# Patient Record
Sex: Female | Born: 1965 | Race: Black or African American | Hispanic: No | Marital: Married | Smoking: Never smoker
Health system: Southern US, Community
[De-identification: ages and names within clinical notes are randomized; demographics above are authoritative.]

## PROBLEM LIST (undated history)

## (undated) HISTORY — PX: OTHER SURGICAL HISTORY: SHX169

---

## 1999-11-18 ENCOUNTER — Emergency Department (HOSPITAL_COMMUNITY): Admission: EM | Admit: 1999-11-18 | Discharge: 1999-11-18 | Payer: Self-pay | Admitting: Emergency Medicine

## 1999-11-18 ENCOUNTER — Encounter: Payer: Self-pay | Admitting: Emergency Medicine

## 2000-05-25 ENCOUNTER — Encounter (HOSPITAL_COMMUNITY): Admission: RE | Admit: 2000-05-25 | Discharge: 2000-07-07 | Payer: Self-pay | Admitting: Obstetrics & Gynecology

## 2000-06-01 ENCOUNTER — Encounter: Payer: Self-pay | Admitting: *Deleted

## 2000-07-06 ENCOUNTER — Inpatient Hospital Stay (HOSPITAL_COMMUNITY): Admission: AD | Admit: 2000-07-06 | Discharge: 2000-07-09 | Payer: Self-pay | Admitting: *Deleted

## 2000-07-06 ENCOUNTER — Encounter (INDEPENDENT_AMBULATORY_CARE_PROVIDER_SITE_OTHER): Payer: Self-pay | Admitting: Specialist

## 2007-06-22 ENCOUNTER — Encounter: Admission: RE | Admit: 2007-06-22 | Discharge: 2007-06-22 | Payer: Self-pay | Admitting: Obstetrics and Gynecology

## 2008-06-26 ENCOUNTER — Encounter: Admission: RE | Admit: 2008-06-26 | Discharge: 2008-06-26 | Payer: Self-pay | Admitting: Obstetrics and Gynecology

## 2008-12-19 ENCOUNTER — Ambulatory Visit (HOSPITAL_COMMUNITY): Admission: RE | Admit: 2008-12-19 | Discharge: 2008-12-19 | Payer: Self-pay | Admitting: Family Medicine

## 2009-06-27 ENCOUNTER — Encounter: Admission: RE | Admit: 2009-06-27 | Discharge: 2009-06-27 | Payer: Self-pay | Admitting: Obstetrics and Gynecology

## 2009-12-03 ENCOUNTER — Encounter: Admission: RE | Admit: 2009-12-03 | Discharge: 2009-12-03 | Payer: Self-pay | Admitting: Chiropractic Medicine

## 2010-07-22 ENCOUNTER — Encounter
Admission: RE | Admit: 2010-07-22 | Discharge: 2010-07-22 | Payer: Self-pay | Source: Home / Self Care | Attending: Obstetrics and Gynecology | Admitting: Obstetrics and Gynecology

## 2010-11-14 NOTE — Op Note (Signed)
[facility]  Patient:    Kelsey Glenn, Kelsey Glenn                     MRN: [id number] Proc. Date: 07/07/00 Adm. Date:  [id number] Disc. Date: [id number] Attending:  Antionette Char Dictator:   Jamey Reas, M.D.                           Operative Report  DATE OF BIRTH:                February 25, 1966.  PREOPERATIVE DIAGNOSIS:       Desire for permanent sterilization, multiparity.  POSTOPERATIVE DIAGNOSIS:      Desire for permanent sterilization, multiparity.  PROCEDURE:                    Postpartum tubal ligation, Pomeroy method.  SURGEON:                      Charles A. Clearance Coots, M.D.  ASSISTANT:                    Jamey Reas, M.D.  ANESTHESIA:                   General endotracheal.  COMPLICATIONS:                None.  ESTIMATED BLOOD LOSS:         Less than 20 cc.  FLUIDS:                       Minimal.  INDICATIONS:                  A 45 year old G3, P3-0-0-3, status post an SVD who desires permanent sterilization. Risks and benefits of procedure discussed with patient including risk of failure 3-5:1000 with increased risk of an ectopic gestation if pregnancy occurs.  FINDINGS:                     Normal uterus, tubes, and ovaries.  DESCRIPTION OF PROCEDURE:     Patient was taken to the operating room where general anesthesia was introduced. Seven cc of Marcaine 0.25% were induced along the area where the incision was going to be made. A small transverse infraumbilical skin incision was then made with the scalpel. The incision was carried down to the underlying fascia until the peritoneum was identified and entered. Peritoneum was noted to be free of any adhesions and the incision was then extended with Metzenbaum scissors. Patients left fallopian tube was then identified, brought to the incision, and grasped with Babcock clamp. Tube then followed out to the fimbria. Babcock clamp was then used to grasp the tube approximately  4 cm from the cornual region. A 3-cm segment of tube was then ligated with two free ties of plain gut and excised. Good hemostasis was noted and the tube returned to the abdomen. The right fallopian tube was then ligated and a 3-cm segment excised in a similar fashion. Excellent hemostasis was noted and the tube returned to the abdomen. The peritoneum and fascia were then closed in a single layer using 3-0 Vicryl suture. A 2-0 Monocryl suture was used to place three interrupted sutures in the subcutaneous area. Skin was then closed in a subcuticular fashion using 4-0 Monocryl suture. The patient tolerated the procedure well. Sponge, lap,  and needle counts were correct x 2. The patient was taken to the recovery room in stable condition.  PATHOLOGY:                    Segments of right and left fallopian tubes. DD:  07/07/00 TD:  07/07/00 Job: [id number]

## 2011-07-30 ENCOUNTER — Encounter (HOSPITAL_COMMUNITY): Payer: Self-pay | Admitting: *Deleted

## 2011-07-30 ENCOUNTER — Emergency Department (INDEPENDENT_AMBULATORY_CARE_PROVIDER_SITE_OTHER): Payer: 59

## 2011-07-30 ENCOUNTER — Emergency Department (INDEPENDENT_AMBULATORY_CARE_PROVIDER_SITE_OTHER)
Admission: EM | Admit: 2011-07-30 | Discharge: 2011-07-30 | Disposition: A | Discharge status: Home / Self Care | Payer: 59 | Source: Home / Self Care | Attending: Emergency Medicine | Admitting: Emergency Medicine

## 2011-07-30 DIAGNOSIS — D649 Anemia, unspecified: Secondary | ICD-10-CM

## 2011-07-30 DIAGNOSIS — K2990 Gastroduodenitis, unspecified, without bleeding: Secondary | ICD-10-CM

## 2011-07-30 DIAGNOSIS — K2970 Gastritis, unspecified, without bleeding: Secondary | ICD-10-CM

## 2011-07-30 LAB — DIFFERENTIAL
Eosinophils Absolute: 0.2 10*3/uL (ref 0.0–0.7)
Eosinophils Relative: 2 % (ref 0–5)
Lymphocytes Relative: 36 % (ref 12–46)
Lymphs Abs: 2.7 10*3/uL (ref 0.7–4.0)
Monocytes Absolute: 0.4 10*3/uL (ref 0.1–1.0)
Monocytes Relative: 5 % (ref 3–12)
Neutro Abs: 4.3 10*3/uL (ref 1.7–7.7)
Neutrophils Relative %: 56 % (ref 43–77)

## 2011-07-30 LAB — CBC
Hemoglobin: 9.9 g/dL — ABNORMAL LOW (ref 12.0–15.0)
MCH: 21.7 pg — ABNORMAL LOW (ref 26.0–34.0)
MCHC: 30.7 g/dL (ref 30.0–36.0)
MCV: 70.8 fL — ABNORMAL LOW (ref 78.0–100.0)
Platelets: 403 10*3/uL — ABNORMAL HIGH (ref 150–400)
RBC: 4.56 MIL/uL (ref 3.87–5.11)

## 2011-07-30 LAB — POCT URINALYSIS DIP (DEVICE)
Bilirubin Urine: NEGATIVE
Nitrite: NEGATIVE
Protein, ur: NEGATIVE mg/dL

## 2011-07-30 MED ORDER — SUCRALFATE 1 GM/10ML PO SUSP: 1.0000 g | Freq: Four times a day (QID) | ORAL | Status: AC

## 2011-07-30 MED ORDER — OMEPRAZOLE 20 MG PO CPDR: 20.0000 mg | DELAYED_RELEASE_CAPSULE | Freq: Two times a day (BID) | ORAL | Status: DC

## 2011-07-30 NOTE — ED Provider Notes (Signed)
Chief Complaint  Patient presents with  . Abdominal Pain    History of Present Illness:  Kelsey Glenn is a 46 year old female who has had a five-day history of left upper quadrant abdominal pain without radiation. She describes a constant dull ache with intermittent sharp, more severe pains. The pains can come on with rest, while driving, or working. They're not related to meals or eating. There no specific relieving factors. She denies any fevers, chills, nausea, vomiting, anorexia, or weight loss. She's had no constipation, diarrhea, hematochezia, or melena. She denies any dysuria, frequency, or urgency. She has had no GYN complaints. She denies any prior history of similar pains in the past. She has had no history of ulcer disease or gastritis. No diverticulitis. She does not smoke or drink. She does use over-the-counter anti-inflammatory meds.  Review of Systems:  Other than noted above, the patient denies any of the following symptoms: Constitutional:  No fever, chills, fatigue, weight loss or anorexia. Lungs:  No cough or shortness of breath. Heart:  No chest pain, palpitations, syncope or edema. Abdomen:  No nausea, vomiting, hematememesis, melena, diarrhea, or hematochezia. GU:  No dysuria, frequency, urgency, or hematuria. Gyn:  No vaginal discharge, itching, abnormal bleeding or pelvic pain. Skin:  No rash or itching.  PMFSH:  Past medical history, family history, social history, meds, and allergies were reviewed.  Physical Exam:   Vital signs:  BP 128/85  Pulse 80  Temp(Src) 98.9 F (37.2 C) (Oral)  Resp 20  SpO2 100%  LMP 07/23/2011 Gen:  Alert, oriented, in no distress. Lungs:  Breath sounds clear and equal bilaterally.  No wheezes, rales or rhonchi. Heart:  Regular rhythm.  No gallops or murmers.   Abdomen:  Abdomen is soft and flat. There is mild tenderness to palpation in the left upper quadrant without guarding or rebound. No organomegaly or mass. Bowel sounds are normally  active. No pulsatile midline abdominal mass or bruit. Skin:  Clear, warm and dry.  No rash.  Labs:   Results for orders placed during the hospital encounter of 07/30/11  POCT H PYLORI SCREEN      Component Value Range   H. PYLORI SCREEN, POC NEGATIVE  NEGATIVE   CBC      Component Value Range   WBC 7.6  4.0 - 10.5 (K/uL)   RBC 4.56  3.87 - 5.11 (MIL/uL)   Hemoglobin 9.9 (*) 12.0 - 15.0 (g/dL)   HCT 86.5 (*) 78.4 - 46.0 (%)   MCV 70.8 (*) 78.0 - 100.0 (fL)   MCH 21.7 (*) 26.0 - 34.0 (pg)   MCHC 30.7  30.0 - 36.0 (g/dL)   RDW 69.6 (*) 29.5 - 15.5 (%)   Platelets 403 (*) 150 - 400 (K/uL)  DIFFERENTIAL      Component Value Range   Neutrophils Relative 56  43 - 77 (%)   Neutro Abs 4.3  1.7 - 7.7 (K/uL)   Lymphocytes Relative 36  12 - 46 (%)   Lymphs Abs 2.7  0.7 - 4.0 (K/uL)   Monocytes Relative 5  3 - 12 (%)   Monocytes Absolute 0.4  0.1 - 1.0 (K/uL)   Eosinophils Relative 2  0 - 5 (%)   Eosinophils Absolute 0.2  0.0 - 0.7 (K/uL)   Basophils Relative 1  0 - 1 (%)   Basophils Absolute 0.0  0.0 - 0.1 (K/uL)  POCT URINALYSIS DIP (DEVICE)      Component Value Range   Glucose, UA NEGATIVE  NEGATIVE (  mg/dL)   Bilirubin Urine NEGATIVE  NEGATIVE    Ketones, ur NEGATIVE  NEGATIVE (mg/dL)   Specific Gravity, Urine 1.025  1.005 - 1.030    Hgb urine dipstick TRACE (*) NEGATIVE    pH 6.0  5.0 - 8.0    Protein, ur NEGATIVE  NEGATIVE (mg/dL)   Urobilinogen, UA 0.2  0.0 - 1.0 (mg/dL)   Nitrite NEGATIVE  NEGATIVE    Leukocytes, UA TRACE (*) NEGATIVE      Radiology:  Dg Abd Acute W/chest  07/30/2011  *RADIOLOGY REPORT*  Clinical Data: Abdominal pain.  Left upper quadrant pain.  ACUTE ABDOMEN SERIES (ABDOMEN 2 VIEW & CHEST 1 VIEW)  Comparison: None.  Findings:  Cardiopericardial silhouette within normal limits. Mediastinal contours normal. Trachea midline.  No airspace disease or effusion.   No free air underneath the hemidiaphragms.  Normal bowel gas pattern.  No dilated loops of large or  small bowel.  Stool and bowel gas extends to the rectosigmoid.Calcified phleboliths are present in the anatomic pelvis.  IMPRESSION: Negative acute abdominal series.  Original Report Authenticated By: Andreas Newport, M.D.    Medications given in UCC:  None  Assessment:   Diagnoses that have been ruled out:  None  Diagnoses that are still under consideration:  None  Final diagnoses:  Gastritis  Anemia    Plan:   1.  The following meds were prescribed:   New Prescriptions   OMEPRAZOLE (PRILOSEC) 20 MG CAPSULE    Take 1 capsule (20 mg total) by mouth 2 (two) times daily.   SUCRALFATE (CARAFATE) 1 GM/10ML SUSPENSION    Take 10 mLs (1 g total) by mouth 4 (four) times daily.   2.  The patient was instructed in symptomatic care and handouts were given. 3.  The patient was told to return if becoming worse in any way, if no better in 3 or 4 days, and given some red flag symptoms that would indicate earlier return.  I told her to make an appointment to followup with a gastroenterologist, Dr. Rob Bunting within the next couple days. If her symptoms should become worse, I suggested that she go to the emergency room right away for further evaluation. I also told her to start on ferrous sulfate 325 mg 3 times daily with meals for her anemia.    Roque Lias, MD 07/30/11 2025

## 2011-07-30 NOTE — ED Notes (Signed)
Pt  Reports  l  Sided  Pain  Underneath  The  Rib  Cage     X  4   Days  -  She  denys  Any  Nausea    Vomiting  Or  Any  Diarrhea   She   denys  Any  Urinary  Symptoms  Or  Vaginal  Symptoms  -  No  Bleeding     Sitting upright  On  Exam  Table  In  No severe  Distress  Speaking in complete  sentances     -  Pleasant

## 2012-01-24 ENCOUNTER — Emergency Department (HOSPITAL_COMMUNITY)
Admission: EM | Admit: 2012-01-24 | Discharge: 2012-01-24 | Disposition: A | Discharge status: Home / Self Care | Payer: Managed Care, Other (non HMO) | Source: Home / Self Care | Attending: Emergency Medicine | Admitting: Emergency Medicine

## 2012-01-24 ENCOUNTER — Encounter (HOSPITAL_COMMUNITY): Payer: Self-pay | Admitting: Emergency Medicine

## 2012-01-24 DIAGNOSIS — J209 Acute bronchitis, unspecified: Secondary | ICD-10-CM

## 2012-01-24 MED ORDER — PREDNISONE 5 MG PO KIT: 1.0000 | PACK | Freq: Every day | ORAL | Status: DC

## 2012-01-24 MED ORDER — ALBUTEROL SULFATE HFA 108 (90 BASE) MCG/ACT IN AERS: 1.0000 | INHALATION_SPRAY | Freq: Four times a day (QID) | RESPIRATORY_TRACT | Status: AC | PRN

## 2012-01-24 MED ORDER — AZITHROMYCIN 250 MG PO TABS: ORAL_TABLET | ORAL | Status: AC

## 2012-01-24 MED ORDER — HYDROCOD POLST-CHLORPHEN POLST 10-8 MG/5ML PO LQCR: 5.0000 mL | Freq: Two times a day (BID) | ORAL | Status: DC | PRN

## 2012-01-24 NOTE — ED Provider Notes (Signed)
Chief Complaint  Patient presents with  . Cough    History of Present Illness:   Kelsey Glenn is a 46 year old female who's had a one-week history of cough productive yellow-green sputum, tightness in chest, nasal congestion with yellow drainage, postnasal drip, ear pressure, chills, sweats, muscle aches particularly in her shoulders, pain in her right mid back with radiation to her right submammary area, sore throat, and hoarseness. She denies any fever, wheezing, nausea, vomiting, or diarrhea.  Review of Systems:  Other than noted above, the patient denies any of the following symptoms. Systemic:  No fever, chills, sweats, fatigue, myalgias, headache, or anorexia. Eye:  No redness, pain or drainage. ENT:  No earache, ear congestion, nasal congestion, sneezing, rhinorrhea, sinus pressure, sinus pain, post nasal drip, or sore throat. Lungs:  No cough, sputum production, wheezing, shortness of breath, or chest pain. GI:  No abdominal pain, nausea, vomiting, or diarrhea. Skin:  No rash or itching.  PMFSH:  Past medical history, family history, social history, meds, and allergies were reviewed.  Physical Exam:   Vital signs:  BP 113/70  Pulse 77  Temp 98.7 F (37.1 C) (Oral)  Resp 14  SpO2 99%  LMP 01/14/2012 General:  Alert, in no distress. Eye:  No conjunctival injection or drainage. Lids were normal. ENT:  TMs and canals were normal, without erythema or inflammation.  Nasal mucosa was clear and uncongested, without drainage.  Mucous membranes were moist.  Pharynx was clear, without exudate or drainage.  There were no oral ulcerations or lesions. Neck:  Supple, no adenopathy, tenderness or mass. Lungs:  No respiratory distress.  Lungs were clear to auscultation, without wheezes, rales or rhonchi.  Breath sounds were clear and equal bilaterally. Lungs were resonant to percussion.  No egophony. Heart:  Regular rhythm, without gallops, murmers or rubs. Skin:  Clear, warm, and dry, without rash  or lesions.  Assessment:  The encounter diagnosis was Acute bronchitis.  Plan:   1.  The following meds were prescribed:   New Prescriptions   ALBUTEROL (PROVENTIL HFA;VENTOLIN HFA) 108 (90 BASE) MCG/ACT INHALER    Inhale 1-2 puffs into the lungs every 6 (six) hours as needed for wheezing.   AZITHROMYCIN (ZITHROMAX Z-PAK) 250 MG TABLET    Take as directed.   CHLORPHENIRAMINE-HYDROCODONE (TUSSIONEX) 10-8 MG/5ML LQCR    Take 5 mLs by mouth every 12 (twelve) hours as needed.   PREDNISONE 5 MG KIT    Take 1 kit (5 mg total) by mouth daily after breakfast. Prednisone 5 mg 6 day dosepack.  Take as directed.   2.  The patient was instructed in symptomatic care and handouts were given. 3.  The patient was told to return if becoming worse in any way, if no better in 3 or 4 days, and given some red flag symptoms that would indicate earlier return.   Reuben Likes, MD 01/24/12 [phone]

## 2012-01-24 NOTE — ED Notes (Signed)
Pt having cold/flu symptoms since 01-17-12 witch got worse, then better. Pt c/o left ear pain, sore throat, and severe cough with yellow mucus. Pt states drainage is much worse at night and she is unable to rest. Pt state she had body aches a few days ago, but those have improved. Pt takes cold/flu meds that help her function during the day but it is much worse at night. Pt also says it feels like she pulled a muscle coughing. She has pain that starts under her right scapula and radiates around under her right breast.

## 2012-04-26 ENCOUNTER — Encounter (HOSPITAL_COMMUNITY): Payer: Self-pay | Admitting: Emergency Medicine

## 2012-04-26 ENCOUNTER — Emergency Department (HOSPITAL_COMMUNITY)
Admission: EM | Admit: 2012-04-26 | Discharge: 2012-04-26 | Disposition: A | Discharge status: Home / Self Care | Payer: Managed Care, Other (non HMO) | Attending: Emergency Medicine | Admitting: Emergency Medicine

## 2012-04-26 ENCOUNTER — Emergency Department (HOSPITAL_COMMUNITY): Payer: Managed Care, Other (non HMO)

## 2012-04-26 DIAGNOSIS — Y9289 Other specified places as the place of occurrence of the external cause: Secondary | ICD-10-CM | POA: Insufficient documentation

## 2012-04-26 DIAGNOSIS — S92502A Displaced unspecified fracture of left lesser toe(s), initial encounter for closed fracture: Secondary | ICD-10-CM

## 2012-04-26 DIAGNOSIS — S92919A Unspecified fracture of unspecified toe(s), initial encounter for closed fracture: Secondary | ICD-10-CM | POA: Insufficient documentation

## 2012-04-26 DIAGNOSIS — X500XXA Overexertion from strenuous movement or load, initial encounter: Secondary | ICD-10-CM | POA: Insufficient documentation

## 2012-04-26 DIAGNOSIS — Y9301 Activity, walking, marching and hiking: Secondary | ICD-10-CM | POA: Insufficient documentation

## 2012-04-26 MED ORDER — HYDROCODONE-ACETAMINOPHEN 5-500 MG PO TABS: 1.0000 | ORAL_TABLET | Freq: Four times a day (QID) | ORAL | Status: AC | PRN

## 2012-04-26 MED ORDER — IBUPROFEN 800 MG PO TABS
800.0000 mg | ORAL_TABLET | Freq: Once | ORAL | Status: AC
  Administered 2012-04-26: 800 mg via ORAL
  Filled 2012-04-26: qty 1

## 2012-04-26 MED ORDER — IBUPROFEN 800 MG PO TABS: 800.0000 mg | ORAL_TABLET | Freq: Three times a day (TID) | ORAL | Status: AC

## 2012-04-26 NOTE — ED Notes (Signed)
Pt reports she turned her foot under while down steps and reports pain on dorsal surface of foot and toes

## 2012-04-26 NOTE — ED Provider Notes (Signed)
History     CSN: [id number]  Arrival date & time 04/26/12  0309   First MD Initiated Contact with Patient 04/26/12 [phone]      Chief Complaint  Patient presents with  . Foot Pain    turned foot on step    (Consider location/radiation/quality/duration/timing/severity/associated sxs/prior treatment) HPI HX per PT, walking down steps earlier in the night, missed a step injuring her left 4th and 5th toes. Initially able to bear weight, went home and pain got worse, presents here for evaluation, no sig swelling, no bleeding, bruising or injury otherwise, no fall, LOC or neck pain, pain is sharp and not radiating. History reviewed. No pertinent past medical history.  Past Surgical History  Procedure Date  . Btl     History reviewed. No pertinent family history.  History  Substance Use Topics  . Smoking status: Never Smoker   . Smokeless tobacco: Not on file  . Alcohol Use: No    OB History    Grav Para Term Preterm Abortions TAB SAB Ect Mult Living                  Review of Systems  HENT: Negative for neck pain.   Musculoskeletal: Negative for back pain.  Skin: Negative for wound.  Neurological: Negative for weakness and numbness.  All other systems reviewed and are negative.    Allergies  Review of patient's allergies indicates no known allergies.  Home Medications   Current Outpatient Rx  Name Route Sig Dispense Refill  . ALBUTEROL SULFATE HFA 108 (90 BASE) MCG/ACT IN AERS Inhalation Inhale 1-2 puffs into the lungs every 6 (six) hours as needed for wheezing. 1 Inhaler 0    BP 132/82  Temp 98.1 F (36.7 C) (Oral)  Resp 18  SpO2 100%  Physical Exam  Nursing note and vitals reviewed. Constitutional: She is oriented to person, place, and time. She appears well-developed and well-nourished.  HENT:  Head: Normocephalic and atraumatic.  Eyes: EOM are normal. Pupils are equal, round, and reactive to light.  Neck: Neck supple.  Cardiovascular: Normal heart  sounds and intact distal pulses.   Pulmonary/Chest: Effort normal. No respiratory distress.  Musculoskeletal:       LLE with TTP over dorsal aspect 4th and 5th digits, no obvious deformity, skin intact no swelling or ecchymosis. NTTP over ankle, tib/fib and knee. Distal N/V intact  Neurological: She is alert and oriented to person, place, and time.  Skin: Skin is warm and dry.    ED Course  Procedures (including critical care time)  Labs Reviewed - No data to display Dg Foot Complete Left  04/26/2012  *RADIOLOGY REPORT*  Clinical Data: Injury, pain.  LEFT FOOT - COMPLETE 3+ VIEW  Comparison: None.  Findings: There is a transverse fracture through the proximal metaphysis of the proximal phalanx of the little toe.  The fracture appears nondisplaced.  Chip fracture off the medial aspect of the base of the middle phalanx of the fourth toe is also identified. No other acute bony or joint abnormality is seen with calcaneal spurring noted.  IMPRESSION: Fractures of the fourth and fifth toes as above.   Original Report Authenticated By: Bernadene Bell. Maricela Curet, M.D.    Gustavus Bryant. Motrin. Imaging as above.  FX boot placed and Ortho referral for close follow up. Stable for d/c home with FX precautions.    MDM   L foot injury with fractures as above on imaging reviewed. PO meds. VS and Nursing notes reviewed.  Sunnie Nielsen, MD 04/26/12 0530

## 2012-04-26 NOTE — ED Notes (Signed)
Paged ortho for post op boot

## 2012-04-26 NOTE — ED Notes (Signed)
Ortho tech in to put boot on pt and to assist with crutches.

## 2012-04-26 NOTE — ED Notes (Signed)
Pt dc to home with family.  Pt states understanding to dc instructions.  Pt ambulatory to exit without difficulty.   

## 2012-08-09 ENCOUNTER — Encounter (HOSPITAL_COMMUNITY): Payer: Self-pay | Admitting: Emergency Medicine

## 2012-08-09 ENCOUNTER — Emergency Department (HOSPITAL_COMMUNITY)
Admission: EM | Admit: 2012-08-09 | Discharge: 2012-08-09 | Disposition: A | Discharge status: Home / Self Care | Payer: Managed Care, Other (non HMO) | Source: Home / Self Care | Attending: Emergency Medicine | Admitting: Emergency Medicine

## 2012-08-09 DIAGNOSIS — H6120 Impacted cerumen, unspecified ear: Secondary | ICD-10-CM

## 2012-08-09 NOTE — ED Notes (Signed)
Right ear clogged and started 2/8, has used debrox and peroxide.

## 2012-08-09 NOTE — ED Provider Notes (Signed)
Chief Complaint  Patient presents with  . Otalgia    History of Present Illness:   Kelsey Glenn is a 47 year old female who has had a four-day history of congestion and slight pain in her right ear. She denies any drainage. She's had no fever, chills, headache, nasal congestion, rhinorrhea, sneezing, sore throat, or cough. She has not had trouble with her ear before.  Review of Systems:  Other than noted above, the patient denies any of the following symptoms: Systemic:  No fevers, chills, sweats, weight loss or gain, fatigue, or tiredness. Eye:  No redness, pain, discharge, itching, blurred vision, or diplopia. ENT:  No headache, nasal congestion, sneezing, itching, epistaxis, ear pain, congestion, decreased hearing, ringing in ears, vertigo, or tinnitus.  No oral lesions, sore throat, pain on swallowing, or hoarseness. Neck:  No mass, tenderness or adenopathy. Lungs:  No coughing, wheezing, or shortness of breath. Skin:  No rash or itching.  PMFSH:  Past medical history, family history, social history, meds, and allergies were reviewed.  Physical Exam:   Vital signs:  BP 119/84  Pulse 77  Temp(Src) 98.6 F (37 C) (Oral)  Resp 18  SpO2 99% General:  Alert and oriented.  In no distress.  Skin warm and dry. Eye:  PERRL, full EOMs, lids and conjunctiva normal.   ENT:  There was a cerumen impaction in the right ear canal. After this was irrigated clear, TMs and canals were clear.  Nasal mucosa not congested and without drainage.  Mucous membranes moist, no oral lesions, normal dentition, pharynx clear.  No cranial or facial pain to palplation. Neck:  Supple, full ROM.  No adenopathy, tenderness or mass.  Thyroid normal. Lungs:  Breath sounds clear and equal bilaterally.  No wheezes, rales or rhonchi. Heart:  Rhythm regular, without extrasystoles.  No gallops or murmers. Skin:  Clear, warm and dry.   Assessment:  The encounter diagnosis was Impacted cerumen.  Plan:   1.  The  following meds were prescribed:   Discharge Medication List as of 08/09/2012  8:22 PM     2.  The patient was instructed in symptomatic care and handouts were given. 3.  The patient was told to return if becoming worse in any way, if no better in 3 or 4 days, and given some red flag symptoms that would indicate earlier return.     Reuben Likes, MD 08/09/12 2101

## 2013-01-02 ENCOUNTER — Emergency Department (INDEPENDENT_AMBULATORY_CARE_PROVIDER_SITE_OTHER): Payer: Managed Care, Other (non HMO)

## 2013-01-02 ENCOUNTER — Emergency Department (HOSPITAL_COMMUNITY)
Admission: EM | Admit: 2013-01-02 | Discharge: 2013-01-02 | Disposition: A | Discharge status: Home / Self Care | Payer: Managed Care, Other (non HMO) | Source: Home / Self Care | Attending: Emergency Medicine | Admitting: Emergency Medicine

## 2013-01-02 ENCOUNTER — Encounter (HOSPITAL_COMMUNITY): Payer: Self-pay | Admitting: *Deleted

## 2013-01-02 DIAGNOSIS — M76899 Other specified enthesopathies of unspecified lower limb, excluding foot: Secondary | ICD-10-CM

## 2013-01-02 DIAGNOSIS — M7061 Trochanteric bursitis, right hip: Secondary | ICD-10-CM

## 2013-01-02 MED ORDER — HYDROCODONE-ACETAMINOPHEN 5-325 MG PO TABS: ORAL_TABLET | ORAL | Status: AC

## 2013-01-02 MED ORDER — METHYLPREDNISOLONE ACETATE 40 MG/ML IJ SUSP
INTRAMUSCULAR | Status: AC
  Filled 2013-01-02: qty 10

## 2013-01-02 NOTE — ED Provider Notes (Signed)
Chief Complaint:   Chief Complaint  Patient presents with  . 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 This is located in the buttock, lateral aspect, and radiates to the groin. It sometimes radiates into the thigh. It is rated 8/10 right now little pain does wax and wane. It's worse when she gets up from a lying or sitting position and better if she presses on the area or stretches. She's tried massage, acupuncture, chiropractic without relief. She denies any injury. There is no radiation below the knee, numbness, tingling, or muscle weakness. No bladder or bowel complaints.  Review of Systems:  Other than noted above, the patient denies any of the following symptoms: Systemic:  No fevers, chills, sweats, or aches.  No fatigue or tiredness. Musculoskeletal:  No joint pain, arthritis, bursitis, swelling, back pain, or neck pain. Neurological:  No muscular weakness, paresthesias, headache, or trouble with speech or coordination.  No dizziness.  PMFSH:  Past medical history, family history, social history, meds, and allergies were reviewed.    Physical Exam:   Vital signs:  BP 126/65  Pulse 72  Temp(Src) 98.4 F (36.9 C) (Oral)  Resp 16  SpO2 100%  LMP 12/26/2012 Gen:  Alert and oriented times 3.  In no distress. Musculoskeletal: There is tenderness to palpation just behind the greater trochanter and in front of the buttock. There is no pain in the groin. The hip has a full range of motion. Faber maneuver is negative. Fadir maneuver was positive. Straight leg raising was negative..  Otherwise, all joints had a full a ROM with no swelling, bruising or deformity.  No edema, pulses full. Extremities were warm and pink.  Capillary refill was brisk.  Skin:  Clear, warm and dry.  No rash. Neuro:  Alert and oriented times 3.  Muscle strength was normal.  Sensation was intact to  light touch.   Radiology:  Dg Hip Complete Right  01/02/2013   *RADIOLOGY REPORT*  Clinical Data: Pain  RIGHT HIP - COMPLETE 2+ VIEW  Comparison: None.  Findings: Frontal pelvis as well as frontal and lateral right hip images were obtained.  There is mild symmetric narrowing of both hip joints.  There is mild bony overgrowth along both lateral acetabula an asymmetric manner.  No fracture or dislocation.  No erosive change.  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  No fracture or dislocation.   Original Report Authenticated By: Bretta Bang, M.D.   I reviewed the images independently and personally and concur with the radiologist's findings.  Procedure Note:  Verbal informed consent was obtained from the patient.  Risks and benefits were outlined with the patient.  Patient understands and accepts these risks.  Identity of the patient was confirmed verbally and by armband.    Procedure was performed as follows: 2 mL of Depo-Medrol 40 mg and 1 mL of 2% Xylocaine were injected into the point of maximal tenderness, just behind the greater trochanter. The area was prepped with Betadine and alcohol and anesthetized with ethyl chloride spray. Patient tolerated the procedure well.  Patient tolerated the procedure well without any immediate complications.  Assessment:  The encounter diagnosis was Trochanteric bursitis, right.  Differential diagnosis includes arthritis, impingement syndrome, labral tear, aseptic necrosis, gluteus medius tendinitis, trochanteric bursitis, sacroiliac arthritis, facet joint arthritis, or lumbar radiculopathy.  Plan:   1.  The following meds were prescribed:   Discharge Medication List as of 01/02/2013  2:04 PM    START taking these medications   Details  HYDROcodone-acetaminophen (NORCO/VICODIN) 5-325 MG per tablet 1 to 2 tabs every 4 to 6 hours as needed for pain., Print       2.  The patient was instructed in symptomatic care, including  rest and activity, elevation, application of ice and compression.  Appropriate handouts were given. 3.  The patient was told to return if becoming worse in any way, if no better in 3 or 4 days, and given some red flag symptoms such as worsening pain or neurological symptoms that would indicate earlier return.   4.  The patient was told to follow up with Dr. Aldean Baker, her orthopedist, if no improvement in one week.    Reuben Likes, MD 01/02/13 2151

## 2013-01-02 NOTE — ED Notes (Signed)
Pt  Reports  Pain r  Hip     X  Several  Days        She  denys    Recent  Injury  -  She   Reports    That  This  Is  An  Ongoing  Problem and  She  Has  Been  Seeing  Chiropractors  And  Acupuncturist      She ambulated  To  Room and  Is  Sitting  Upright on  Exam table

## 2013-11-03 IMAGING — CR DG ABDOMEN ACUTE W/ 1V CHEST
3 series · 3 of 3 positions shown · non-contrast
Comparison: None.

CLINICAL DATA: Abdominal pain.  Left upper quadrant pain.

ACUTE ABDOMEN SERIES (ABDOMEN 2 VIEW & CHEST 1 VIEW)

[view not recorded (1 of 3)]
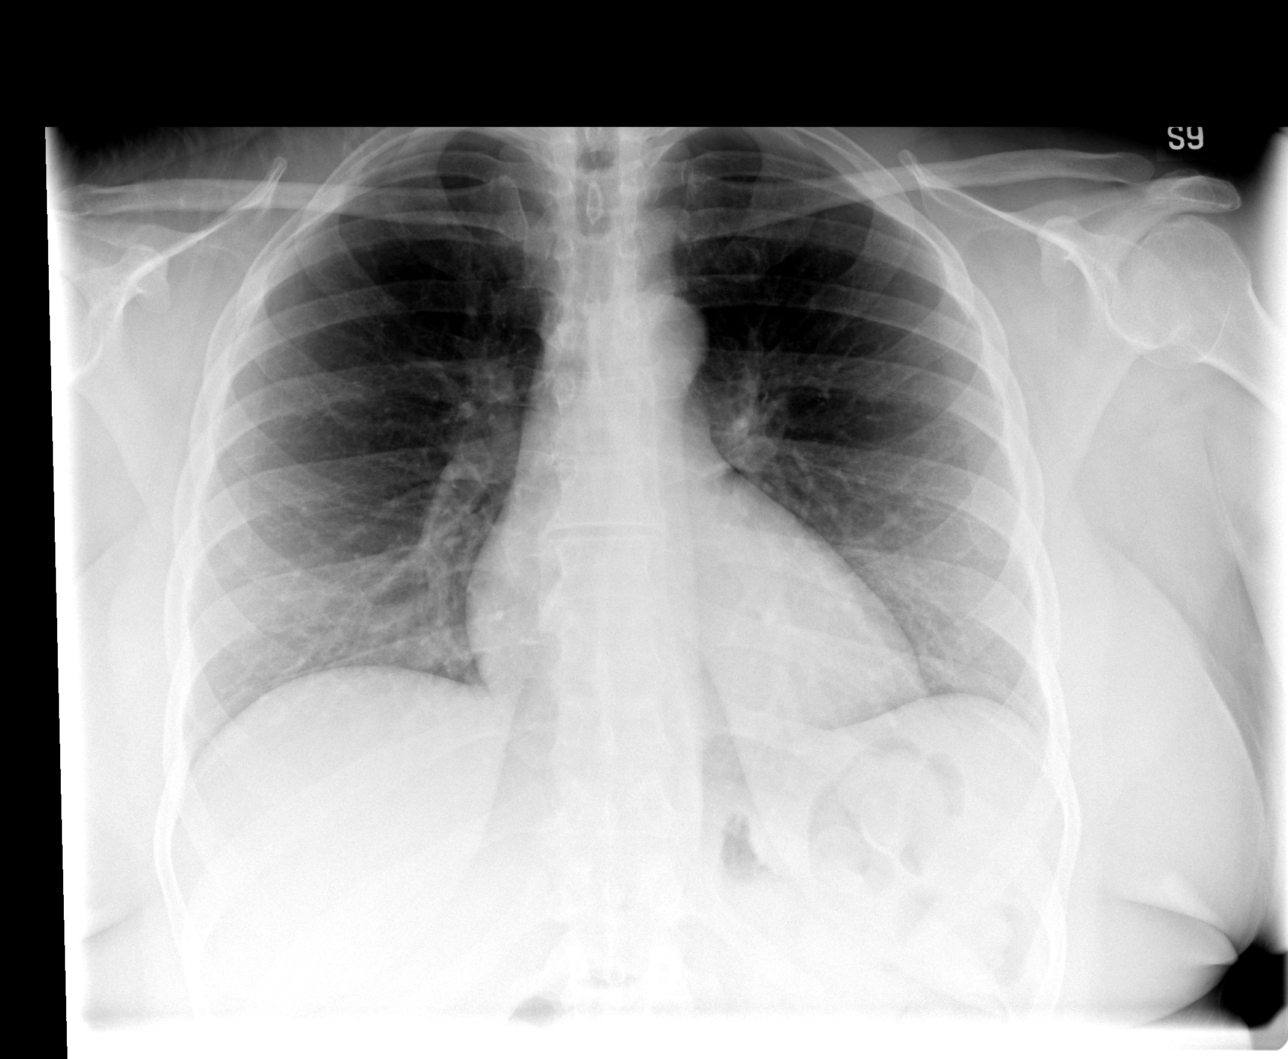

[view not recorded (2 of 3)]
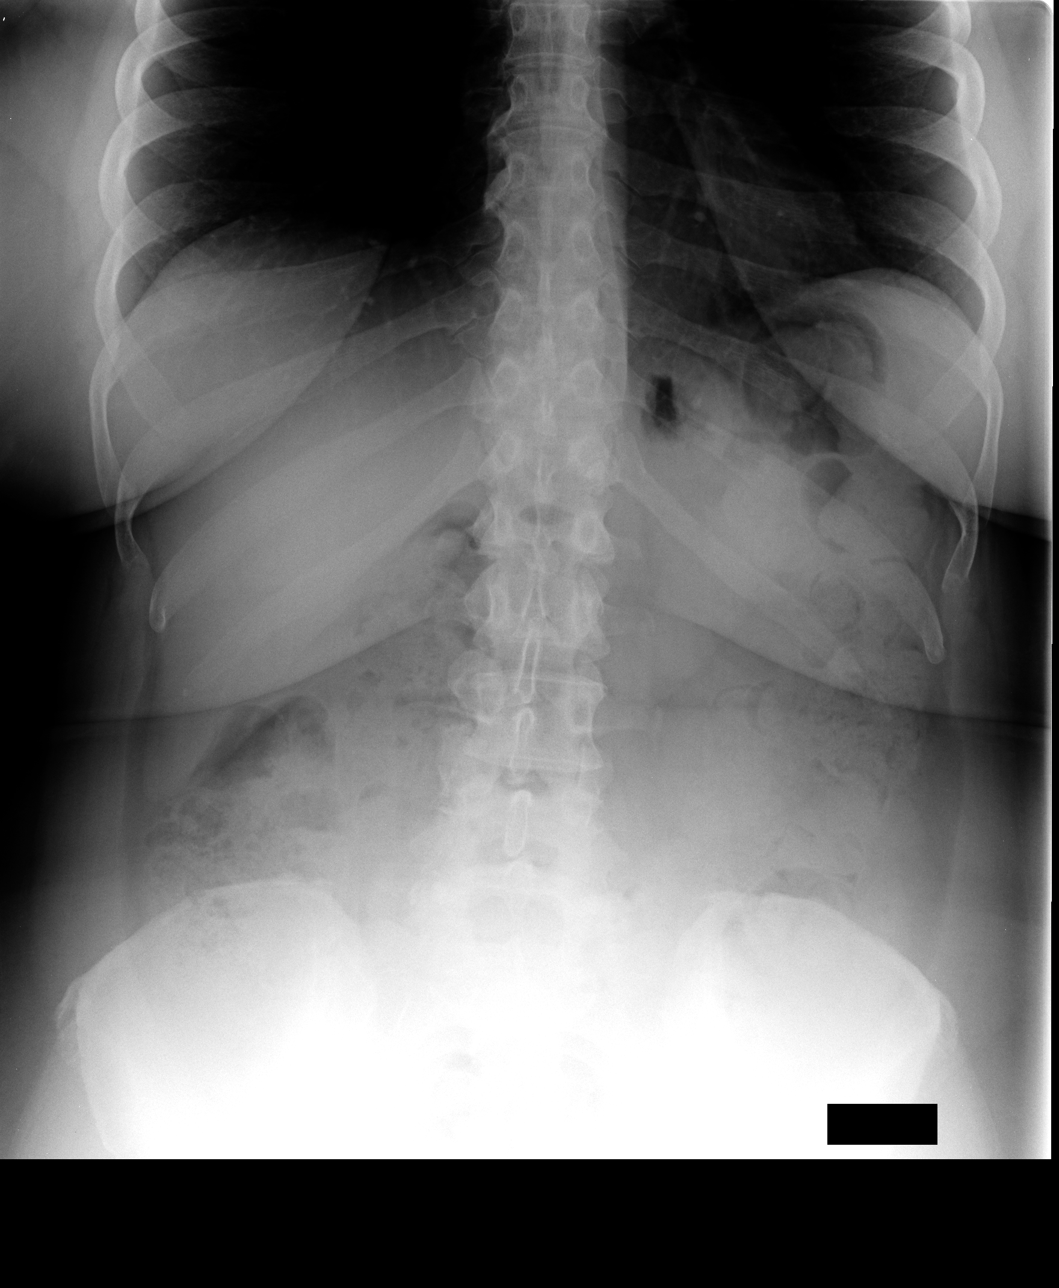

[view not recorded (3 of 3)]
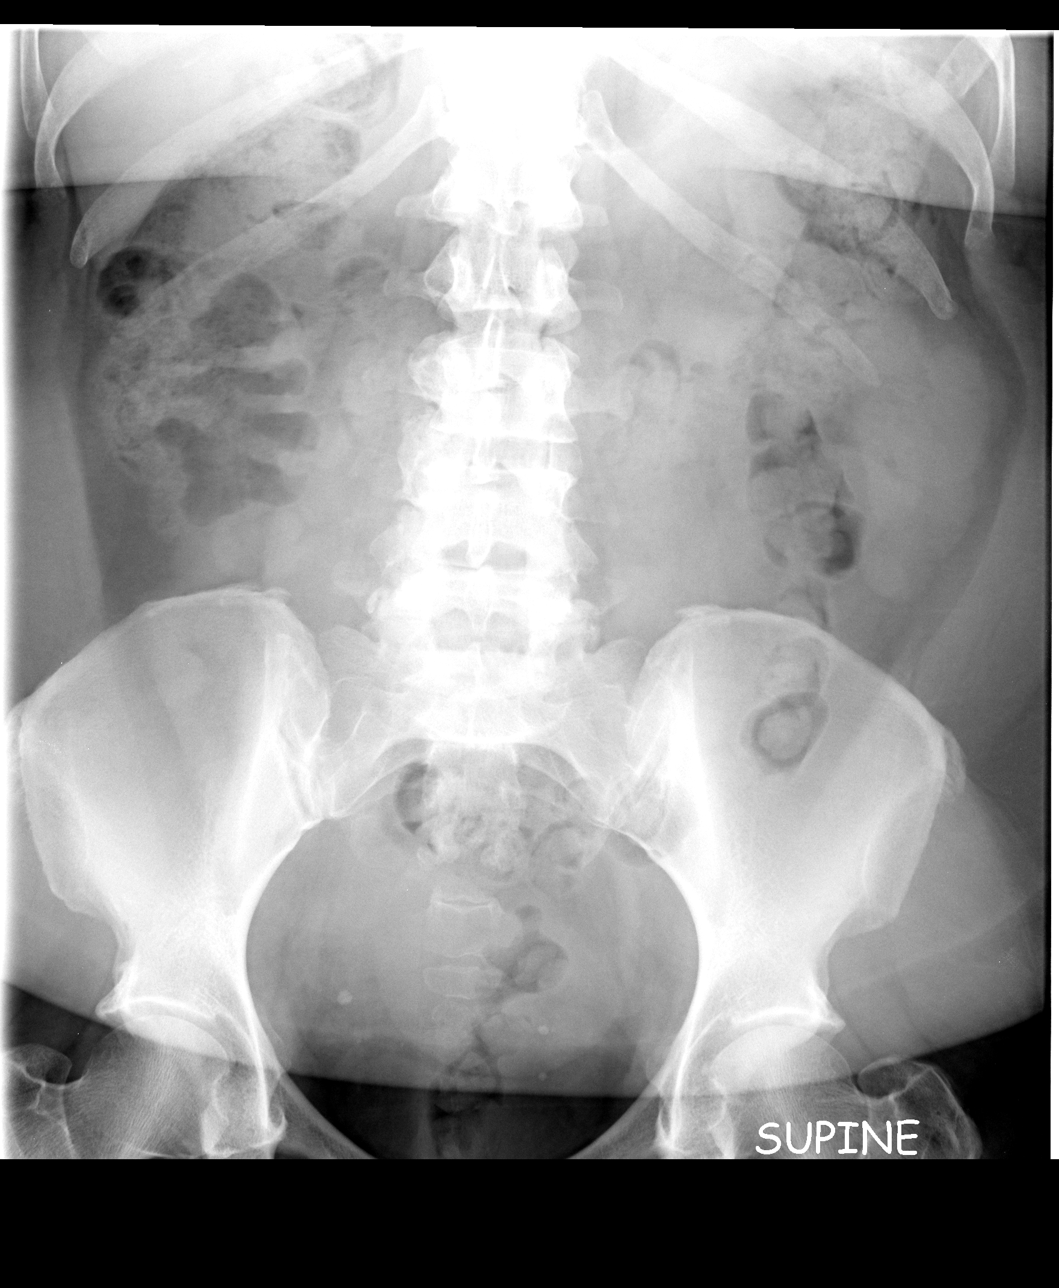

[3 of 3 positions shown; findings below may reference images not displayed]

FINDINGS: Cardiopericardial silhouette within normal limits.
Mediastinal contours normal. Trachea midline.  No airspace disease
or effusion.

 No free air underneath the hemidiaphragms.  Normal bowel gas
pattern.  No dilated loops of large or small bowel.  Stool and
bowel gas extends to the rectosigmoid.Calcified phleboliths are
present in the anatomic pelvis.
IMPRESSION: Negative acute abdominal series.

## 2015-04-09 IMAGING — CR DG HIP COMPLETE 2+V*R*
3 series · 3 of 3 positions shown · non-contrast
Comparison: None.

CLINICAL DATA: Pain

RIGHT HIP - COMPLETE 2+ VIEW

[view not recorded (1 of 3)]
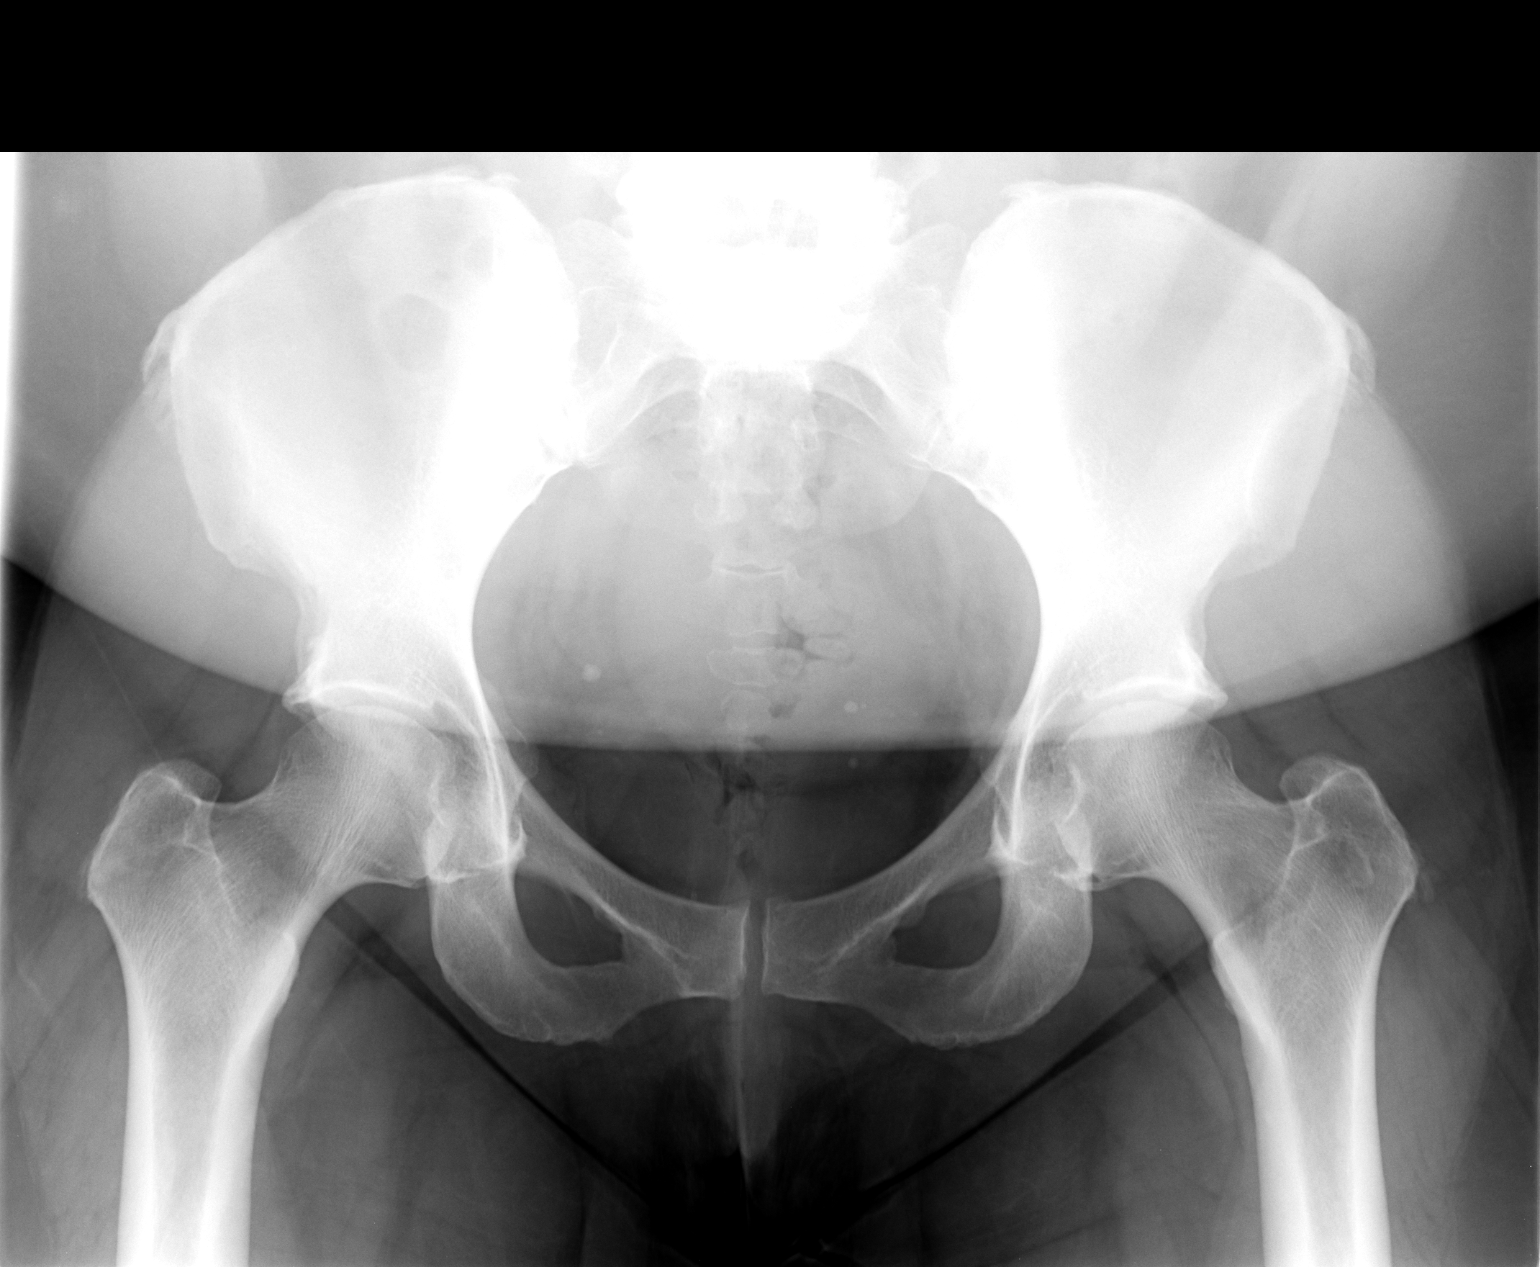

[view not recorded (2 of 3)]
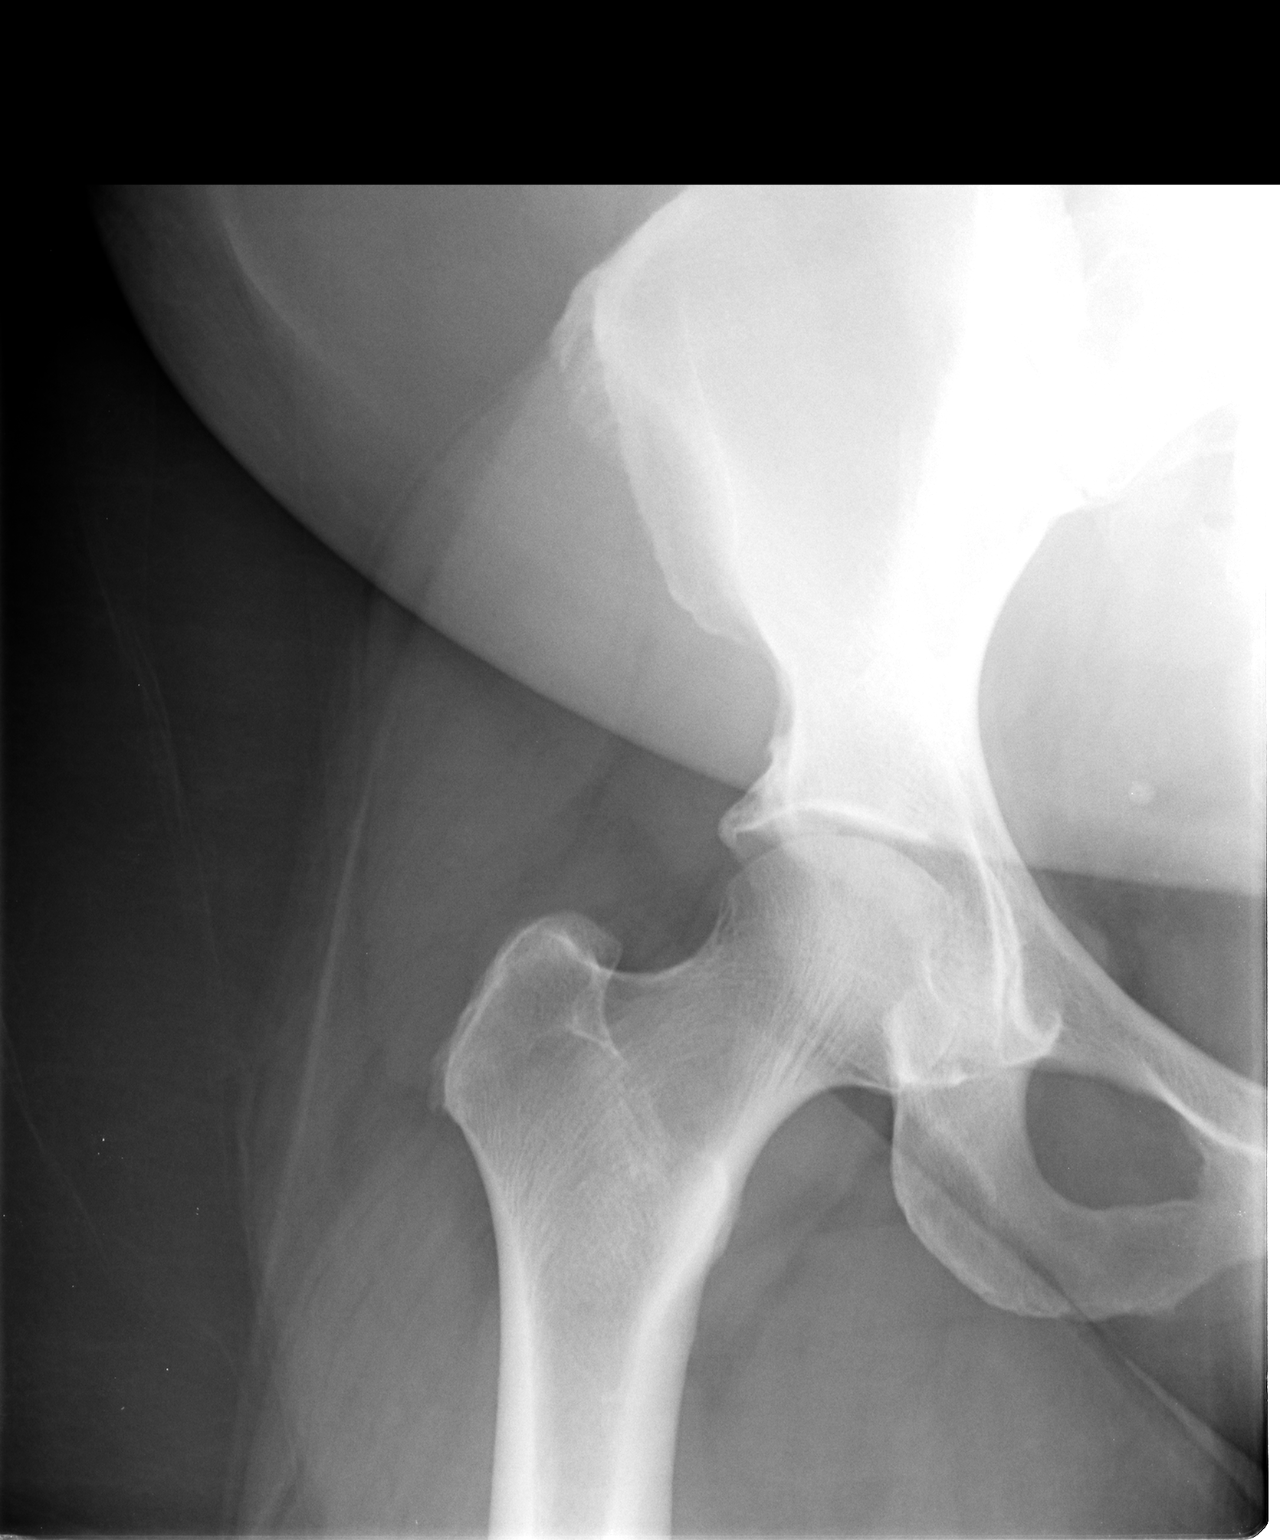

[view not recorded (3 of 3)]
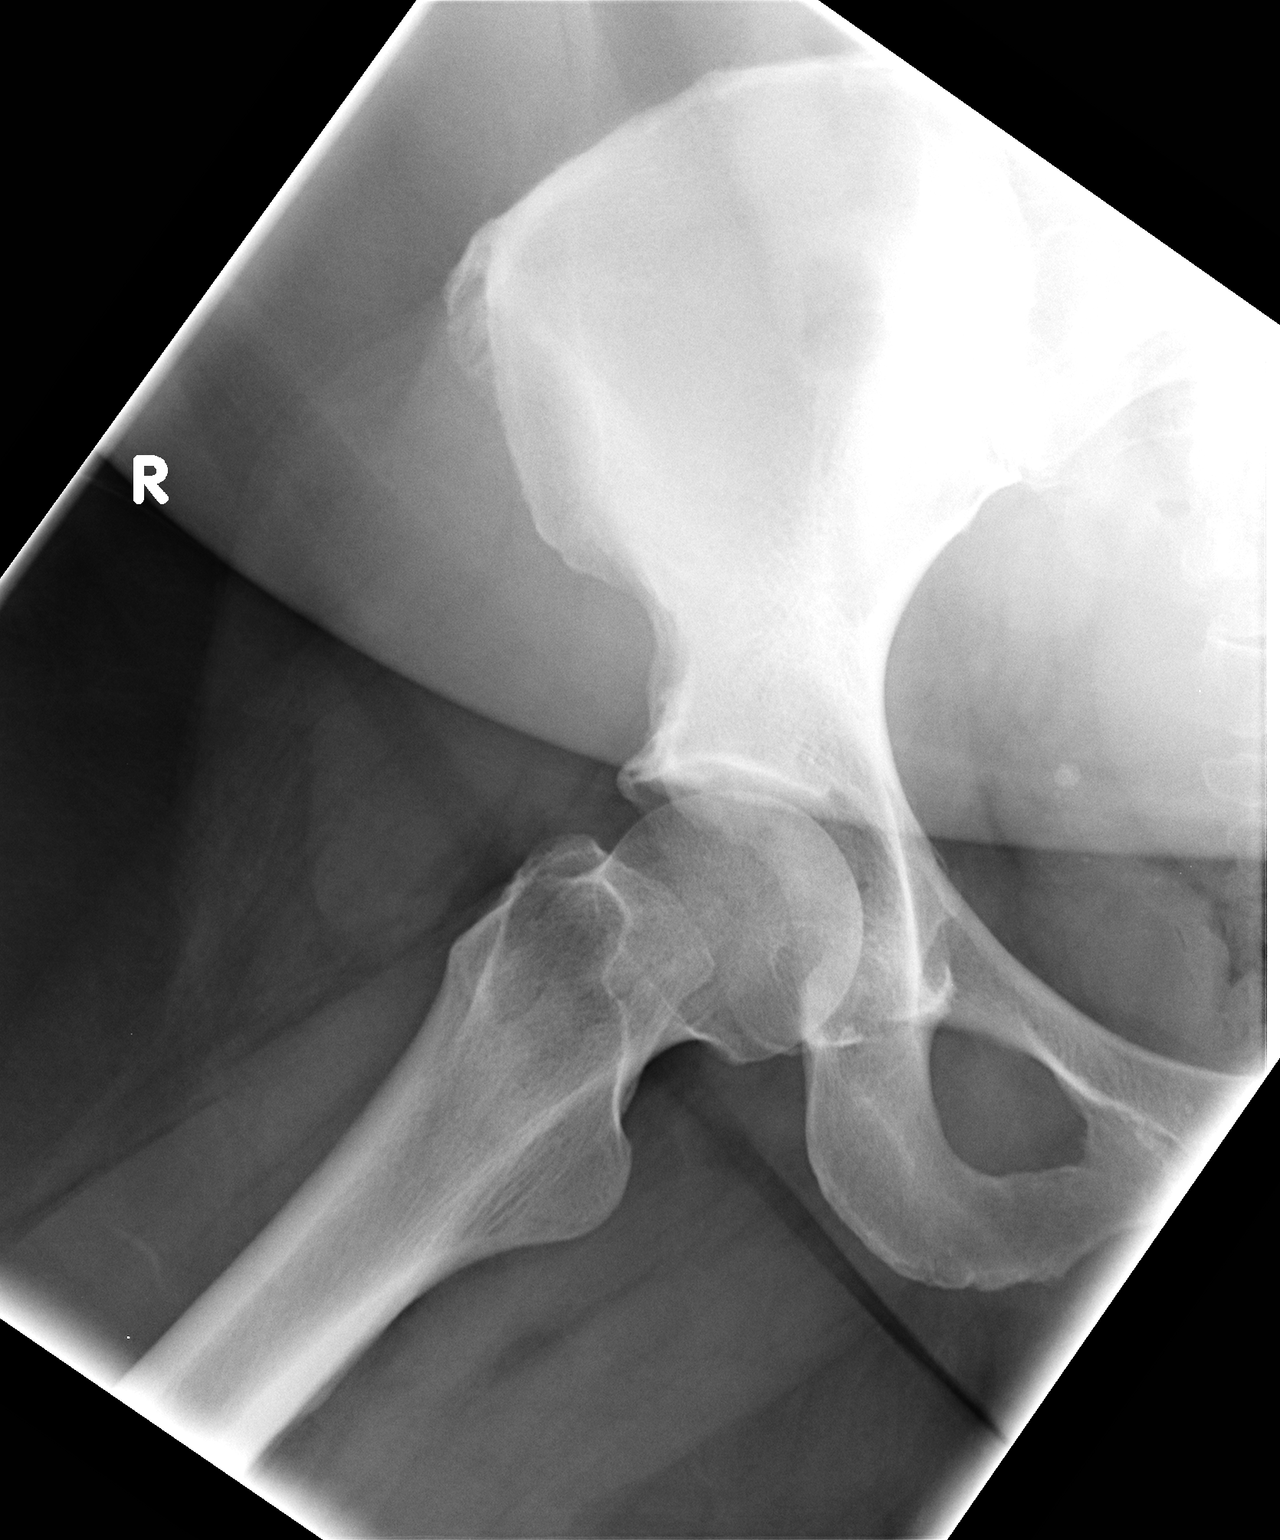

[3 of 3 positions shown; findings below may reference images not displayed]

FINDINGS: Frontal pelvis as well as frontal and lateral right hip
images were obtained.  There is mild symmetric narrowing of both
hip joints.  There is mild bony overgrowth along both lateral
acetabula an asymmetric manner.  No fracture or dislocation.  No
erosive change.
IMPRESSION: Mild symmetric osteoarthritic change in both hip joints.  No
fracture or dislocation.

## 2021-12-12 ENCOUNTER — Encounter: Payer: Self-pay | Admitting: Pulmonary Disease

## 2021-12-12 ENCOUNTER — Other Ambulatory Visit: Payer: Self-pay | Admitting: Pharmacy Technician

## 2021-12-12 DIAGNOSIS — D508 Other iron deficiency anemias: Secondary | ICD-10-CM | POA: Insufficient documentation

## 2021-12-17 ENCOUNTER — Telehealth: Payer: Self-pay | Admitting: Pharmacy Technician

## 2021-12-17 NOTE — Telephone Encounter (Addendum)
Auth Submission: no auth needed Payer: [employer] Medication & CPT/J Code(s) submitted: Venofer (Iron Sucrose) [id number] Route of submission (phone, fax, portal): phone Auth type: Buy/Bill Units/visits requested: x2 doses Reference number:  Approval from: 12/18/21 to 03/20/22  Patient will be scheduled as soon as possible

## 2021-12-18 ENCOUNTER — Encounter: Payer: Self-pay | Admitting: Pulmonary Disease

## 2021-12-18 ENCOUNTER — Ambulatory Visit (INDEPENDENT_AMBULATORY_CARE_PROVIDER_SITE_OTHER): Payer: 59

## 2021-12-18 VITALS — BP 119/79 | HR 77 | Temp 98.7°F | Resp 18 | Ht 62.0 in | Wt 225.0 lb

## 2021-12-18 DIAGNOSIS — D508 Other iron deficiency anemias: Secondary | ICD-10-CM | POA: Diagnosis not present

## 2021-12-18 MED ORDER — SODIUM CHLORIDE 0.9 % IV SOLN
500.0000 mg | Freq: Once | INTRAVENOUS | Status: AC
  Administered 2021-12-18: 500 mg via INTRAVENOUS
  Filled 2021-12-18: qty 25

## 2021-12-18 NOTE — Progress Notes (Cosign Needed)
Diagnosis: Iron Deficiency Anemia  Provider:  Chilton Greathouse, MD  Procedure: Infusion  IV Type: Peripheral, IV Location: L Hand  Venofer (Iron Sucrose), Dose: 500 mg  Infusion Start Time: 1005  Infusion Stop Time: 1350  Post Infusion IV Care: Observation period completed and Peripheral IV Discontinued  Discharge: Condition: Good, Destination: Home . AVS provided to patient.   Performed by:  Sandie Ano, RN

## 2022-01-01 ENCOUNTER — Ambulatory Visit (INDEPENDENT_AMBULATORY_CARE_PROVIDER_SITE_OTHER): Payer: 59

## 2022-01-01 VITALS — BP 107/72 | HR 72 | Temp 98.2°F | Resp 16 | Ht 62.0 in | Wt 224.2 lb

## 2022-01-01 DIAGNOSIS — D508 Other iron deficiency anemias: Secondary | ICD-10-CM | POA: Diagnosis not present

## 2022-01-01 MED ORDER — SODIUM CHLORIDE 0.9 % IV SOLN
500.0000 mg | Freq: Once | INTRAVENOUS | Status: AC
  Administered 2022-01-01: 500 mg via INTRAVENOUS
  Filled 2022-01-01: qty 25

## 2022-01-01 NOTE — Progress Notes (Signed)
Diagnosis: Iron Deficiency Anemia  Provider:  Chilton Greathouse, MD  Procedure: Infusion  IV Type: Peripheral, IV Location: L Antecubital  Venofer (Iron Sucrose), Dose: 500 mg  Infusion Start Time: 0908  Infusion Stop Time: 1317  Post Infusion IV Care: Peripheral IV Discontinued  Discharge: Condition: Good, Destination: Home . AVS provided to patient.   Performed by:  Nat Math, RN
